# Patient Record
Sex: Female | Born: 1960 | Race: White | Hispanic: No | Marital: Single
Health system: Southern US, Community
[De-identification: ages and names within clinical notes are randomized; demographics above are authoritative.]

---

## 1998-06-30 ENCOUNTER — Other Ambulatory Visit: Admission: RE | Admit: 1998-06-30 | Discharge: 1998-06-30 | Payer: Self-pay | Admitting: Obstetrics & Gynecology

## 1998-11-11 ENCOUNTER — Ambulatory Visit (HOSPITAL_COMMUNITY): Admission: RE | Admit: 1998-11-11 | Discharge: 1998-11-11 | Payer: Self-pay | Admitting: Obstetrics & Gynecology

## 1999-03-15 ENCOUNTER — Emergency Department (HOSPITAL_COMMUNITY): Admission: EM | Admit: 1999-03-15 | Discharge: 1999-03-15 | Payer: Self-pay | Admitting: Emergency Medicine

## 1999-03-15 ENCOUNTER — Encounter: Payer: Self-pay | Admitting: Emergency Medicine

## 1999-03-18 ENCOUNTER — Ambulatory Visit (HOSPITAL_BASED_OUTPATIENT_CLINIC_OR_DEPARTMENT_OTHER): Admission: RE | Admit: 1999-03-18 | Discharge: 1999-03-18 | Payer: Self-pay | Admitting: Orthopedic Surgery

## 2004-08-03 ENCOUNTER — Ambulatory Visit (HOSPITAL_BASED_OUTPATIENT_CLINIC_OR_DEPARTMENT_OTHER): Admission: RE | Admit: 2004-08-03 | Discharge: 2004-08-03 | Payer: Self-pay | Admitting: Orthopedic Surgery

## 2004-08-03 ENCOUNTER — Ambulatory Visit (HOSPITAL_COMMUNITY): Admission: RE | Admit: 2004-08-03 | Discharge: 2004-08-03 | Payer: Self-pay | Admitting: Orthopedic Surgery

## 2005-08-29 ENCOUNTER — Ambulatory Visit: Payer: Self-pay

## 2011-01-26 ENCOUNTER — Emergency Department: Payer: Self-pay | Admitting: Internal Medicine

## 2011-02-06 ENCOUNTER — Ambulatory Visit: Payer: Self-pay | Admitting: Internal Medicine

## 2011-02-07 ENCOUNTER — Ambulatory Visit: Payer: Self-pay | Admitting: Internal Medicine

## 2011-02-25 ENCOUNTER — Emergency Department: Payer: Self-pay | Admitting: Emergency Medicine

## 2011-04-03 ENCOUNTER — Ambulatory Visit: Payer: Self-pay | Admitting: Gastroenterology

## 2011-04-05 LAB — PATHOLOGY REPORT

## 2011-04-10 ENCOUNTER — Ambulatory Visit: Payer: Self-pay | Admitting: Gastroenterology

## 2011-07-24 ENCOUNTER — Ambulatory Visit: Payer: Self-pay | Admitting: Obstetrics and Gynecology

## 2011-08-03 ENCOUNTER — Ambulatory Visit: Payer: Self-pay | Admitting: Obstetrics and Gynecology

## 2011-08-07 LAB — PATHOLOGY REPORT
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
## 2011-11-07 ENCOUNTER — Ambulatory Visit: Payer: Self-pay | Admitting: Obstetrics and Gynecology

## 2012-01-25 ENCOUNTER — Ambulatory Visit: Payer: Self-pay | Admitting: Otolaryngology

## 2012-01-25 LAB — HEMOGLOBIN: HGB: 12.1 g/dL (ref 12.0–16.0)

## 2012-01-26 LAB — PATHOLOGY REPORT

## 2013-03-09 ENCOUNTER — Emergency Department: Payer: Self-pay | Admitting: Emergency Medicine

## 2013-03-09 LAB — COMPREHENSIVE METABOLIC PANEL
Albumin: 3.1 g/dL — ABNORMAL LOW (ref 3.4–5.0)
Alkaline Phosphatase: 133 U/L (ref 50–136)
Anion Gap: 6 — ABNORMAL LOW (ref 7–16)
BUN: 20 mg/dL — ABNORMAL HIGH (ref 7–18)
Bilirubin,Total: 0.6 mg/dL (ref 0.2–1.0)
Calcium, Total: 9.9 mg/dL (ref 8.5–10.1)
Chloride: 99 mmol/L (ref 98–107)
Co2: 28 mmol/L (ref 21–32)
Creatinine: 0.49 mg/dL — ABNORMAL LOW (ref 0.60–1.30)
EGFR (African American): >60
EGFR (Non-African Amer.): >60
Glucose: 137 mg/dL — ABNORMAL HIGH (ref 65–99)
Osmolality: 271 (ref 275–301)
Potassium: 4.2 mmol/L (ref 3.5–5.1)
SGOT(AST): 29 U/L (ref 15–37)
SGPT (ALT): 50 U/L (ref 12–78)
Sodium: 133 mmol/L — ABNORMAL LOW (ref 136–145)
Total Protein: 7.9 g/dL (ref 6.4–8.2)

## 2013-03-09 LAB — CBC
HCT: 41 % (ref 35.0–47.0)
HGB: 14.3 g/dL (ref 12.0–16.0)
MCH: 30 pg (ref 26.0–34.0)
MCHC: 34.8 g/dL (ref 32.0–36.0)
MCV: 86 fL (ref 80–100)
Platelet: 153 10*3/uL (ref 150–440)
RBC: 4.75 10*6/uL (ref 3.80–5.20)
RDW: 17.4 % — ABNORMAL HIGH (ref 11.5–14.5)
WBC: 11.2 10*3/uL — ABNORMAL HIGH (ref 3.6–11.0)

## 2013-03-09 LAB — URINALYSIS, COMPLETE
Bilirubin,UR: NEGATIVE
Glucose,UR: 50 mg/dL (ref 0–75)
Ketone: NEGATIVE
Leukocyte Esterase: NEGATIVE
Nitrite: NEGATIVE
Ph: 6 (ref 4.5–8.0)
Protein: NEGATIVE
RBC,UR: 6 /HPF (ref 0–5)
Specific Gravity: 1.018 (ref 1.003–1.030)
Squamous Epithelial: NONE SEEN
WBC UR: 1 /HPF (ref 0–5)

## 2013-03-09 LAB — MAGNESIUM: Magnesium: 1.9 mg/dL

## 2013-03-16 DEATH — deceased

## 2015-02-07 NOTE — Op Note (Signed)
PATIENT NAME:  Natalie Cox, Ayrianna MR#:  [id number] DATE OF BIRTH:  1961/03/17  DATE OF PROCEDURE:  01/25/2012  PREOPERATIVE DIAGNOSIS: Right cheek mass (3.5 x 2.5 cm).   POSTOPERATIVE DIAGNOSIS: Right cheek mass (3.5 x 2.5 cm).   PROCEDURES:  1. Resection deep right cheek mass.   2. Advancement rotation flap.   SURGEON: Zackery BarefootJ. Madison Wolf Boulay, MD   DESCRIPTION OF PROCEDURE: The patient was placed in the supine position on the operating room table. After general endotracheal anesthesia had been induced, the patient was turned 90 degrees counterclockwise from anesthesia and placed in a beach chair position. The cheek was examined under magnification and operating room lighting. The planned incision was marked, locally anesthetized, prepped and draped in the usual fashion. A 15 blade was used to make a favorably beveled incision around the lesion. The lesion was meticulously dissected from the deep subcutaneous tissues and on top of the facial nerve which was positively identified and confirmation was carried out with facial nerve stimulator. The Stensen's duct was also identified and preserved. The mass was sent for pathologic analysis and a SMAS flap was then advanced over the parotidomasseteric fascia. A medially based advancement rotation flap was then carried out with a backcut superiorly and this was advanced and rotated into position and secured with multiple interrupted 4-0 Vicryl sutures. The skin was then closed with interrupted 7-0 nylon sutures. The patient was then returned to anesthesia, allowed to emerge from anesthesia in the operating room, and taken to the recovery room in stable condition. There were no complications. Estimated blood loss 15 mL.   ____________________________ J. Gertie BaronMadison Avanthika Dehnert, MD [email or web address] D: 01/25/2012 13:45:03 ET T: 01/25/2012 15:07:48 ET JOB#: [id number]  cc: Zackery BarefootJ. Madison Juvon Teater, MD, <Dictator> Wendee CoppJMADISON Isobelle Tuckett MD ELECTRONICALLY SIGNED 02/14/2012 18:24
